# Patient Record
Sex: Female | Born: 1985 | Race: White | Hispanic: No | Marital: Single
Health system: Southern US, Community
[De-identification: ages and names within clinical notes are randomized; demographics above are authoritative.]

---
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
## 2006-05-12 ENCOUNTER — Emergency Department (HOSPITAL_COMMUNITY): Admission: EM | Admit: 2006-05-12 | Discharge: 2006-05-12 | Payer: Self-pay | Admitting: Emergency Medicine

## 2008-03-03 ENCOUNTER — Emergency Department (HOSPITAL_COMMUNITY): Admission: EM | Admit: 2008-03-03 | Discharge: 2008-03-03 | Payer: Self-pay | Admitting: Emergency Medicine

## 2008-12-13 IMAGING — CR DG CHEST 2V
2 series · 2 of 2 positions shown · non-contrast
Comparison: None.

CLINICAL DATA: 20 year-old-female with acute chest pain, cough, congestion, sore throat. 
CHEST - 2 VIEW:

[view not recorded (1 of 2)]
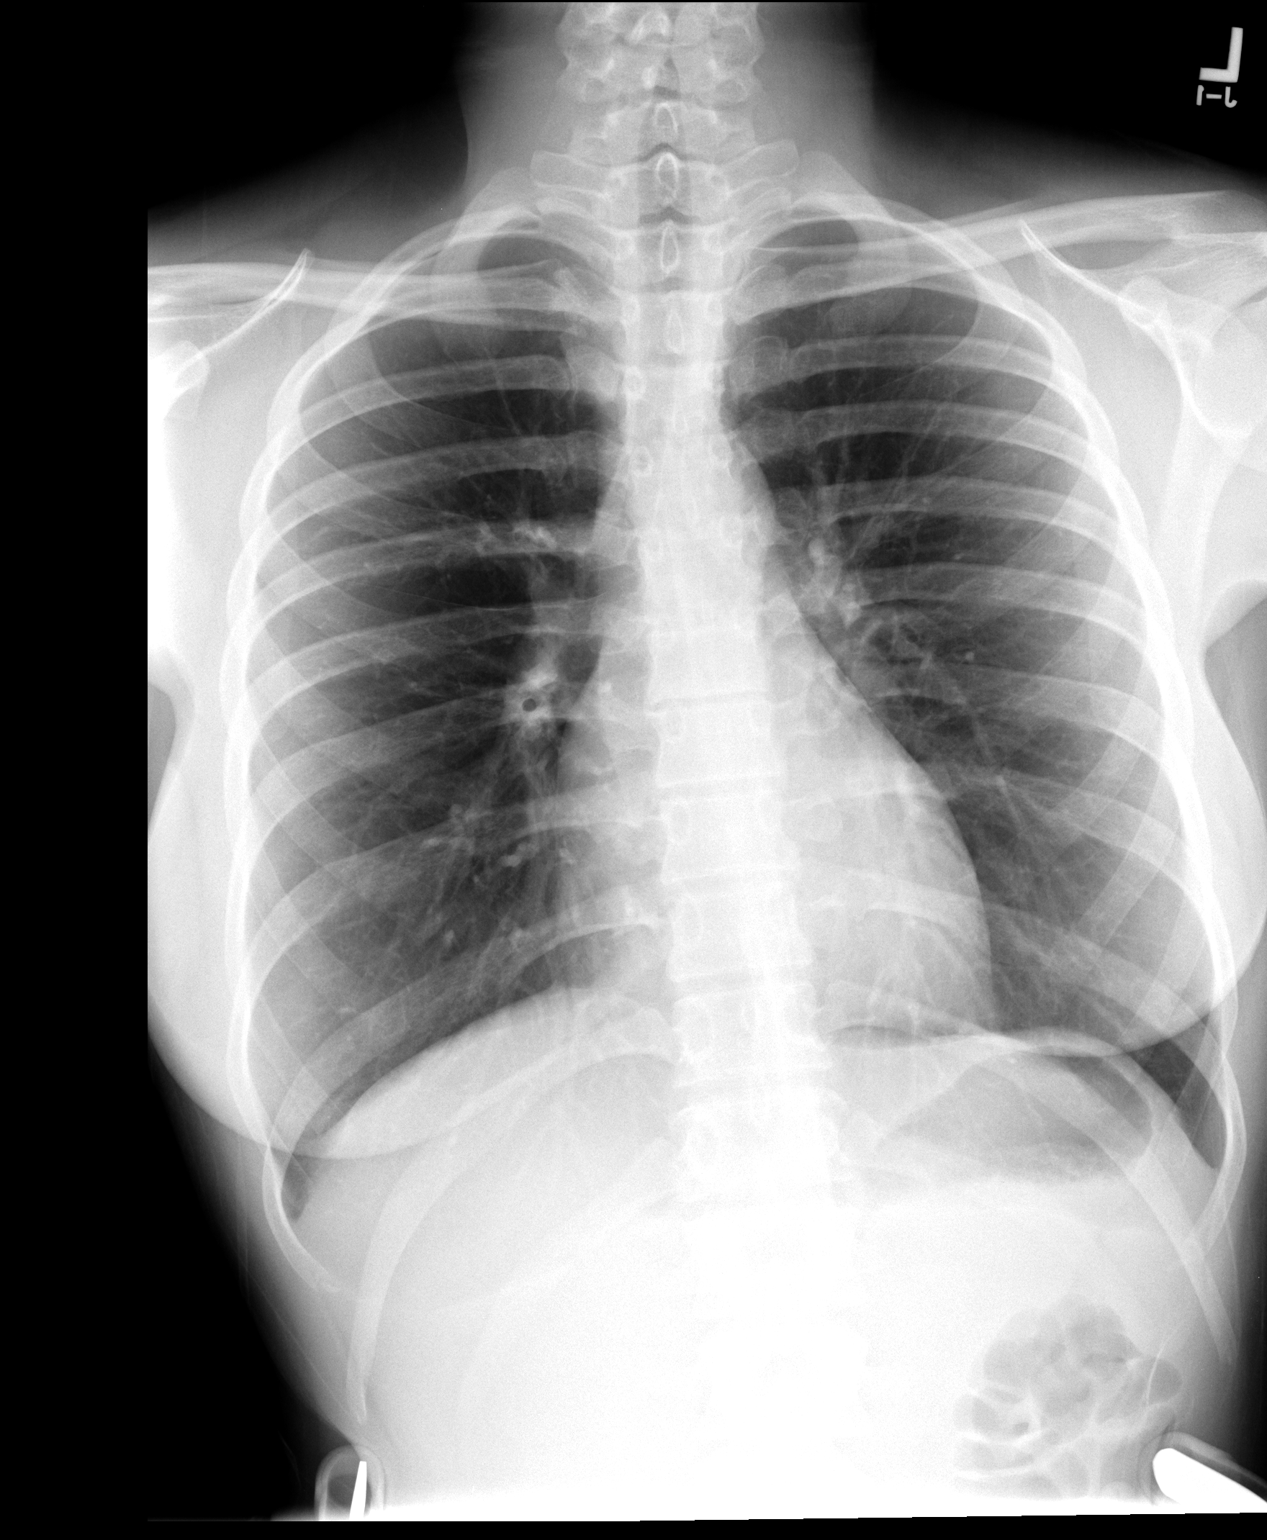

[view not recorded (2 of 2)]
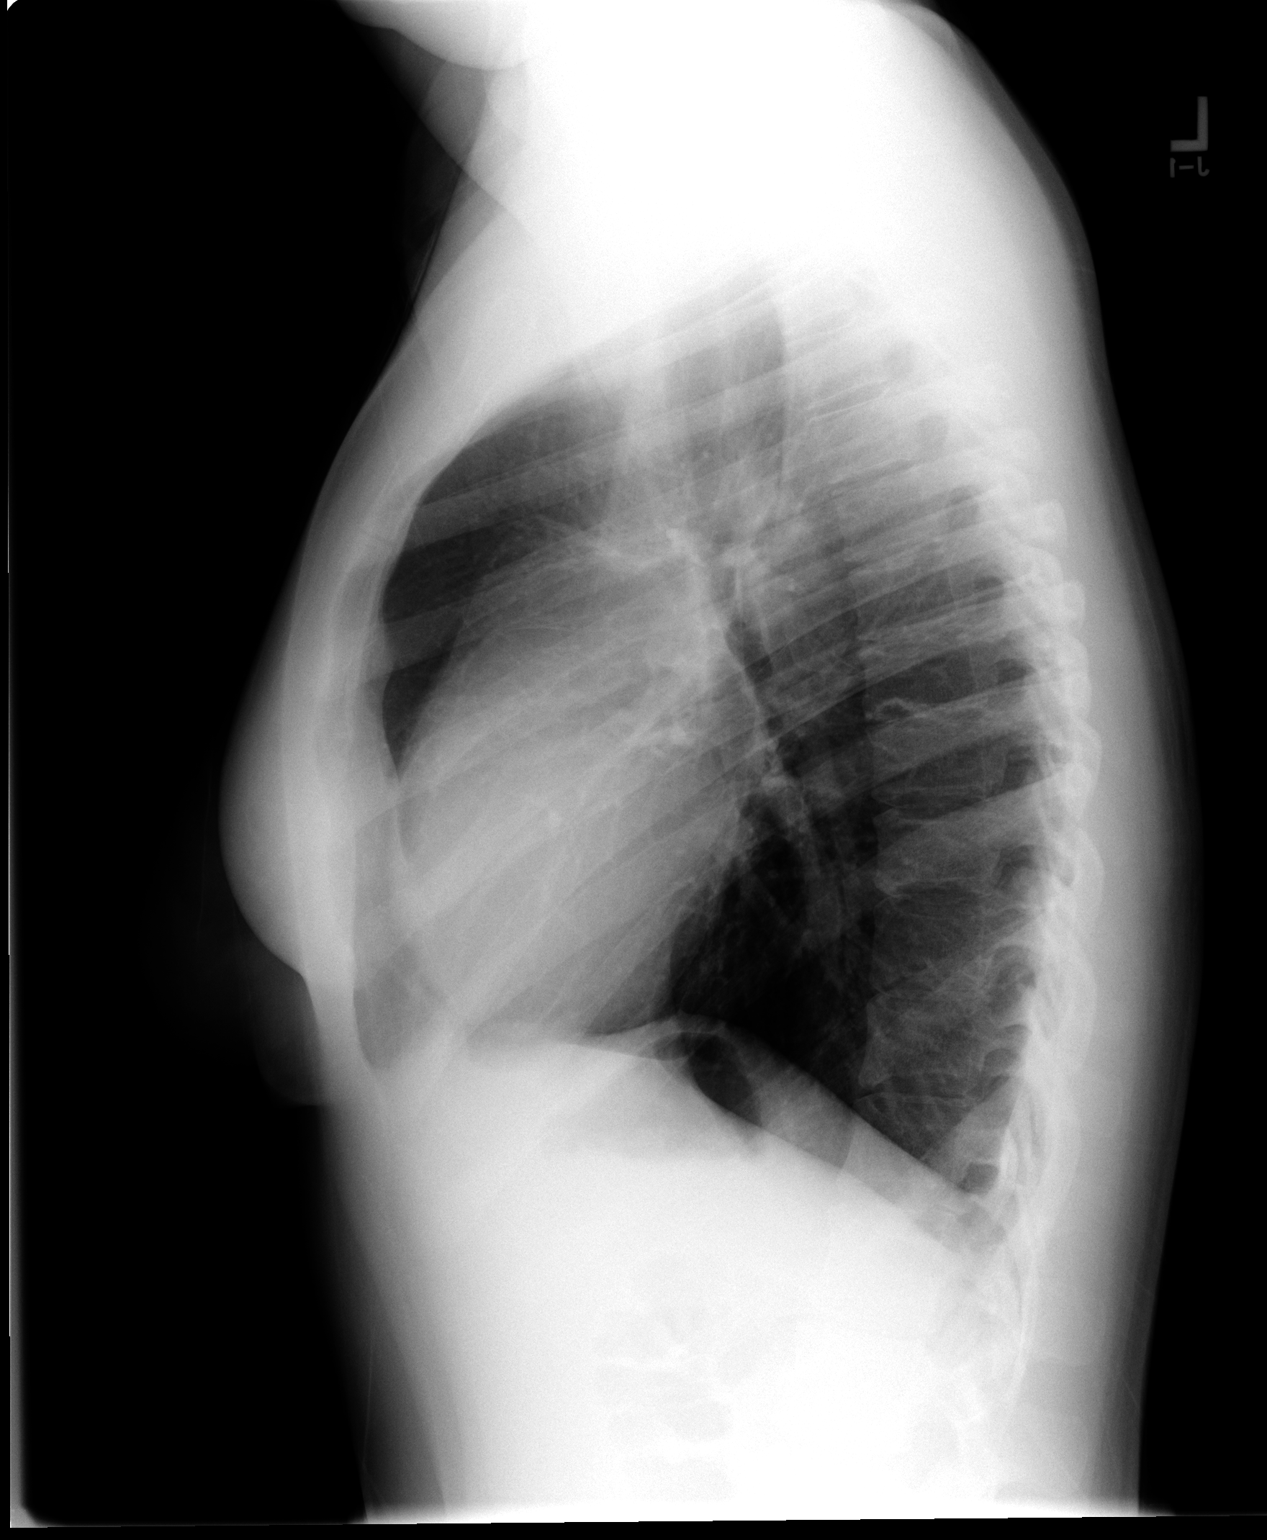

[2 of 2 positions shown; findings below may reference images not displayed]

FINDINGS: Mild airway thickening centrally, suspicious for bronchitis.  There is also hyperinflation.  No acute consolidation, pneumonia, effusion, edema or pneumothorax.
IMPRESSION: Central airway thickening and hyperinflation, suspect bronchitis pattern.

## 2009-07-13 ENCOUNTER — Emergency Department (HOSPITAL_COMMUNITY): Admission: EM | Admit: 2009-07-13 | Discharge: 2009-07-13 | Payer: Self-pay | Admitting: Emergency Medicine

## 2010-03-25 LAB — URINALYSIS, ROUTINE W REFLEX MICROSCOPIC
Glucose, UA: NEGATIVE mg/dL
Ketones, ur: NEGATIVE mg/dL
Specific Gravity, Urine: 1.025 (ref 1.005–1.030)
pH: 6.5 (ref 5.0–8.0)

## 2010-03-25 LAB — POCT PREGNANCY, URINE

## 2015-09-04 ENCOUNTER — Encounter: Payer: Self-pay | Admitting: Gastroenterology

## 2015-09-27 ENCOUNTER — Ambulatory Visit: Payer: Self-pay | Admitting: Nurse Practitioner

## 2015-09-27 ENCOUNTER — Telehealth: Payer: Self-pay | Admitting: Nurse Practitioner

## 2015-09-27 ENCOUNTER — Encounter: Payer: Self-pay | Admitting: Nurse Practitioner

## 2015-09-27 NOTE — Telephone Encounter (Signed)
PT WAS A NO SHOW AND LETTER SENT  °

## 2015-09-28 NOTE — Telephone Encounter (Signed)
Noted
